# Patient Record
Sex: Male | Born: 1990 | Race: White | Hispanic: No | Marital: Married
Health system: Midwestern US, Academic
[De-identification: ages and names within clinical notes are randomized; demographics above are authoritative.]

---

## 2020-08-26 ENCOUNTER — Encounter: Admit: 2020-08-26 | Discharge: 2020-08-26

## 2020-08-26 ENCOUNTER — Ambulatory Visit: Admit: 2020-08-26 | Discharge: 2020-08-26

## 2020-08-26 DIAGNOSIS — R079 Chest pain, unspecified: Secondary | ICD-10-CM

## 2021-05-06 ENCOUNTER — Encounter: Admit: 2021-05-06 | Discharge: 2021-05-06 | Payer: Private Health Insurance - Indemnity

## 2021-05-10 ENCOUNTER — Encounter: Admit: 2021-05-10 | Discharge: 2021-05-10 | Payer: Private Health Insurance - Indemnity

## 2021-05-10 NOTE — Telephone Encounter
05/10/21 - Records requested from Dr Herschell Dimes [phone], and Pauline Aus [phone] per [facility] note below lkg    --Lona Kettle, MD [phone]--  --[facility] [phone]--

## 2021-05-11 ENCOUNTER — Encounter: Admit: 2021-05-11 | Discharge: 2021-05-11 | Payer: Private Health Insurance - Indemnity

## 2021-05-12 ENCOUNTER — Encounter: Admit: 2021-05-12 | Discharge: 2021-05-12 | Payer: Private Health Insurance - Indemnity

## 2021-05-12 ENCOUNTER — Ambulatory Visit: Admit: 2021-05-12 | Discharge: 2021-05-12 | Payer: Private Health Insurance - Indemnity

## 2021-05-12 ENCOUNTER — Ambulatory Visit: Admit: 2021-05-12 | Discharge: 2021-05-13 | Payer: Private Health Insurance - Indemnity

## 2021-05-12 DIAGNOSIS — Z8279 Family history of other congenital malformations, deformations and chromosomal abnormalities: Secondary | ICD-10-CM

## 2021-05-12 DIAGNOSIS — R079 Chest pain, unspecified: Secondary | ICD-10-CM

## 2021-05-12 DIAGNOSIS — Z289 Immunization not carried out for unspecified reason: Secondary | ICD-10-CM

## 2021-05-12 DIAGNOSIS — I10 Essential (primary) hypertension: Secondary | ICD-10-CM

## 2021-05-12 DIAGNOSIS — N289 Disorder of kidney and ureter, unspecified: Secondary | ICD-10-CM

## 2021-05-12 DIAGNOSIS — E669 Obesity, unspecified: Secondary | ICD-10-CM

## 2021-05-12 DIAGNOSIS — Q231 Congenital insufficiency of aortic valve: Secondary | ICD-10-CM

## 2021-05-12 DIAGNOSIS — R072 Precordial pain: Secondary | ICD-10-CM

## 2021-05-12 DIAGNOSIS — I208 Other forms of angina pectoris: Secondary | ICD-10-CM

## 2021-05-12 DIAGNOSIS — E6609 Other obesity due to excess calories: Secondary | ICD-10-CM

## 2021-05-12 DIAGNOSIS — Z8249 Family history of ischemic heart disease and other diseases of the circulatory system: Secondary | ICD-10-CM

## 2021-05-12 DIAGNOSIS — U071 COVID-19 virus infection: Secondary | ICD-10-CM

## 2021-05-12 DIAGNOSIS — Z136 Encounter for screening for cardiovascular disorders: Secondary | ICD-10-CM

## 2021-05-12 NOTE — Progress Notes
Date of Service: 05/12/2021    Edward Mathis is a 31 y.o. male.       HPI      Edward Mathis is a 31 y.o. white  male with a history of hypertension since he was in 6 grade, transient renal insufficiency (peak creatinine 1.4 mg/dL), family history of bicuspid aortic valve, obesity with a BMI 37.02 kg/m?, he history of COVID-19 19 viral syndrome in March 2020, and history of chest pain that prompted an ER visit on 05/04/2021.    Patient reports that prior to these hospital evaluation he had been experiencing chest pain for several days, he describes it as a squeezing sensation, a stinging pain that radiates across the precordium, associated with lightheadedness, it lasted for a few hours, and it occurred at rest and with physical activity.    Patient was evaluated in the emergency room department in [facility] on 05/04/2021, upon presentation to the hospital patient's blood pressure was 122/58 mmHg with a heart rate of 96 bpm.    Laboratory work was remarkable for an elevated creatinine up to 1.4 mg/dL, the Chem-7 dated 1/61/0960 demonstrated a normal creatinine of 1.12 mg/dL.  The troponin markers were unremarkable.    Cholesterol profile demonstrated the following: TC = 201 mg/dL, TG = 454 mg/dL, HDL = 40 mg/dL, and LDL = 098 mg/dL.    Patient reports that since released from the emergency room department he did experience some episodes of chest pain but was less intense.    He was evaluated with the following studies:    1.  2D echo Doppler study dated 05/12/2021- normal LVEF, the aortic valve was poorly visualized, and although it was stated that there were no valvular abnormalities, it is uncertain whether a bicuspid aortic valve is present.  2.  Perfusion imaging study dated 05/12/2021- the tomographic images were reviewed by me, they did not demonstrate any ischemia, SSS equals 0, SRS equals 0, LVEF 60%, normal LVEDV = 107 mL.    Family history: Patient has a brother that has a bicuspid aortic valve, 1 uncle also has a bicuspid aortic valve, grandmother had an MI at age 74, the other grandmother had an MI at age 54, both parents have hypertension    Social history: Patient works with heavy equipment.  He does not smoke cigarettes and does not drink alcohol.           Vitals:    05/12/21 1449 05/12/21 1458   BP: 136/76 132/76   BP Source: Arm, Left Upper Arm, Right Upper   Pulse: 100    SpO2: 98%    O2 Percent: 98 %    O2 Device: None (Room air)    PainSc: Three    Weight: 120.4 kg (265 lb 6.4 oz)    Height: 180.3 cm (5' 11)      Body mass index is 37.02 kg/m?Marland Kitchen     Past Medical History  Patient Active Problem List    Diagnosis Date Noted   ? Family history of bicuspid aortic valve 05/12/2021   ? Renal insufficiency 05/12/2021   ? Class 2 obesity without serious comorbidity with body mass index (BMI) of 37.0 to 37.9 in adult 05/12/2021   ? 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 Chest pain 05/11/2021   ? Primary hypertension 05/11/2021         Review of Systems   Constitutional: Positive for malaise/fatigue.   HENT: Negative.    Eyes: Negative.  Cardiovascular: Positive for chest pain.   Respiratory: Positive for snoring.    Endocrine: Positive for polydipsia.   Hematologic/Lymphatic: Negative.    Skin: Negative.    Musculoskeletal: Positive for back pain.   Gastrointestinal: Positive for bloating, abdominal pain and heartburn.   Genitourinary: Negative.    Neurological: Positive for light-headedness and paresthesias.   Psychiatric/Behavioral: Negative.    Allergic/Immunologic: Negative.        Physical Exam  General Appearance: normal in appearance  Skin: warm, moist, no ulcers or xanthomas  Eyes: conjunctivae and lids normal, pupils are equal and round  Lips & Oral Mucosa: no pallor or cyanosis  Neck Veins: neck veins are flat, neck veins are not distended  Chest Inspection: chest is normal in appearance  Respiratory Effort: breathing comfortably, no respiratory distress  Auscultation/Percussion: lungs clear to auscultation, no rales or rhonchi, no wheezing  Cardiac Rhythm: regular rhythm and normal rate  Cardiac Auscultation: S1, S2 normal, no rub, no gallop  Murmurs: no murmur  Carotid Arteries: normal carotid upstroke bilaterally, no bruit  Lower Extremity Edema: no lower extremity edema  Abdominal Exam: soft, non-tender, no masses, bowel sounds normal  Liver & Spleen: no organomegaly  Language and Memory: patient responsive and seems to comprehend information  Neurologic Exam: neurological assessment grossly intact      Cardiovascular Studies  Twelve-lead EKG demonstrates normal sinus rhythm, no ST segment T wave changes, ventricular rate 90 bpm, normal PR/QT/QTc interval    Cardiovascular Health Factors  Vitals BP Readings from Last 3 Encounters:   05/12/21 132/76   05/12/21 122/58   08/26/20 122/80     Wt Readings from Last 3 Encounters:   05/12/21 120.4 kg (265 lb 6.4 oz)   05/12/21 116 kg (255 lb 11.7 oz)   08/26/20 116.6 kg (257 lb)     BMI Readings from Last 3 Encounters:   05/12/21 37.02 kg/m?   05/12/21 35.67 kg/m?   08/26/20 34.86 kg/m?      Smoking Social History     Tobacco Use   Smoking Status Never   Smokeless Tobacco Former   ? Types: Chew      Lipid Profile Cholesterol   Date Value Ref Range Status   04/09/2019 201 (H) <200 Final     HDL   Date Value Ref Range Status   04/09/2019 40  Final     LDL   Date Value Ref Range Status   04/09/2019 123 (H) <100 Final     Triglycerides   Date Value Ref Range Status   04/09/2019 192 (H) <150 Final      Blood Sugar No results found for: HGBA1C  Glucose   Date Value Ref Range Status   05/04/2021 103  Final          Problems Addressed Today  Encounter Diagnoses   Name Primary?   ? Primary hypertension Yes   ? Precordial pain    ? Screening for heart disease    ? Bicuspid aortic valve    ? Family history of bicuspid aortic valve    ? Renal insufficiency    ? Class 2 obesity due to excess calories without serious comorbidity with body mass index (BMI) of 37.0 to 37.9 in adult    ? Atypical angina (HCC)    ? COVID-19 virus infection    ? COVID-19 vaccine dose not administered        Assessment and Plan    Assessment:    1.  Atypical anginaPatient  did present with atypical symptoms  ? He was evaluated with a perfusion imaging study on 05/12/2021-no ischemia, normal LVEF  2.  Family history of bicuspid aortic valve- a brother and an uncle have a bicuspid aortic valve  ? He was evaluated with a 2D echo Doppler study on 08/26/2020 and today on 05/12/2021, I reviewed both studies, the aortic valve is suboptimally visualized and it cannot be determined with certainty that patient does not have a bicuspid aortic valve  3.  Primary hypertension-patient was diagnosed with this when he was in sixth grade, he is currently treated with a combination of olmesartan/hydrochlorothiazide  4.  Obesity-patient's BMI 37.02 kg/m?  5.  Family history of hypertension  6.  History of COVID-19 viral syndrome in March 2020  7.  Patient did not go COVID-19 mRNA vaccine  8.  Transient renal insufficiency  ? Upon presentation to the emergency room on 05/04/2021 patient's creatinine was 1.4 mg/dL it currently normalized, it is 1.12 mg/dL on 1/91/4782  9.  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  I suggested further evaluation with a limited 2D echo Doppler study in our [facility] office to determine whether patient does/does not have a bicuspid aortic valve  2.  If this limited echocardiogram will not be relevant, then we will proceed with a TEE evaluation  3.  We discussed at length about the importance of weight reduction, overall risk factors modification, strict low-sodium diet, low-fat, low-cholesterol, low sugar, low carbohydrate diet  4.  If patient will be found to have a bicuspid arctic valve and we will also proceed with a evaluation of the aorta  On the 2D echo Doppler study dated 05/12/2021 the sinuses of Valsalva measure 3.9 cm (normal range), and the proximal ascending aorta was 2.7 cm (normal range).         Total Time Today was 65 minutes in the following activities: Preparing to see the patient, Obtaining and/or reviewing separately obtained history, Performing a medically appropriate examination and/or evaluation, Counseling and educating the patient/family/caregiver, Ordering medications, tests, or procedures, Referring and communication with other health care professionals (when not separately reported), Documenting clinical information in the electronic or other health record, Independently interpreting results (not separately reported) and communicating results to the patient/family/caregiver and Care coordination (not separately reported)         Current Medications (including today's revisions)  ? aspirin EC 81 mg tablet Take 81 mg by mouth daily. Take with food.   ? olmesartan-hydroCHLOROthiazide (BENICAR HCT) 40-12.5 mg tablet Take 1 tablet by mouth daily.

## 2021-05-13 ENCOUNTER — Encounter: Admit: 2021-05-13 | Discharge: 2021-05-13 | Payer: Private Health Insurance - Indemnity

## 2021-06-01 ENCOUNTER — Encounter: Admit: 2021-06-01 | Discharge: 2021-06-01 | Payer: Private Health Insurance - Indemnity

## 2021-06-01 ENCOUNTER — Ambulatory Visit: Admit: 2021-06-01 | Discharge: 2021-06-01 | Payer: Private Health Insurance - Indemnity

## 2021-06-01 DIAGNOSIS — Q231 Congenital insufficiency of aortic valve: Secondary | ICD-10-CM

## 2021-06-01 DIAGNOSIS — R072 Precordial pain: Secondary | ICD-10-CM

## 2021-06-01 DIAGNOSIS — Z136 Encounter for screening for cardiovascular disorders: Secondary | ICD-10-CM

## 2021-06-01 DIAGNOSIS — I10 Essential (primary) hypertension: Secondary | ICD-10-CM

## 2021-06-02 ENCOUNTER — Encounter: Admit: 2021-06-02 | Discharge: 2021-06-02 | Payer: Private Health Insurance - Indemnity

## 2021-06-02 NOTE — Telephone Encounter
-----   Message from Dorris Fetch, MD sent at 06/02/2021 12:35 PM CST -----  Please set the patient up for a TEE, it appears that he does have a bicuspid aortic valve, which is not certain though, I think we need to clarify this.  This TEE can be scheduled whenever it is convenient for the patient and when we can put it in our schedule.    Thank you      ----- Message -----  From: Dorris Fetch, MD  Sent: 06/02/2021  12:34 PM CST  To: Dorris Fetch, MD

## 2023-07-11 IMAGING — US ECHOCOMPL
1 series · 13 of 24 positions shown · non-contrast
Comparison: none

[Series 1: us echo 2d, complete · 105 acquisitions, 13 frames shown]
[im 1/105]
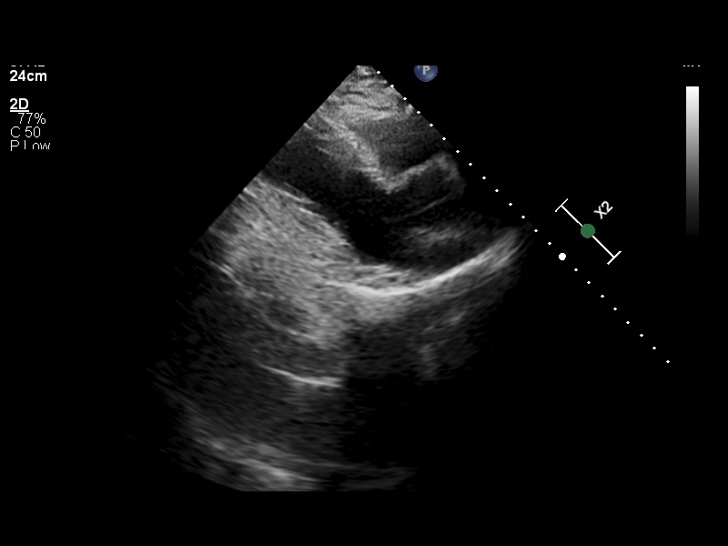
[im 10/105]
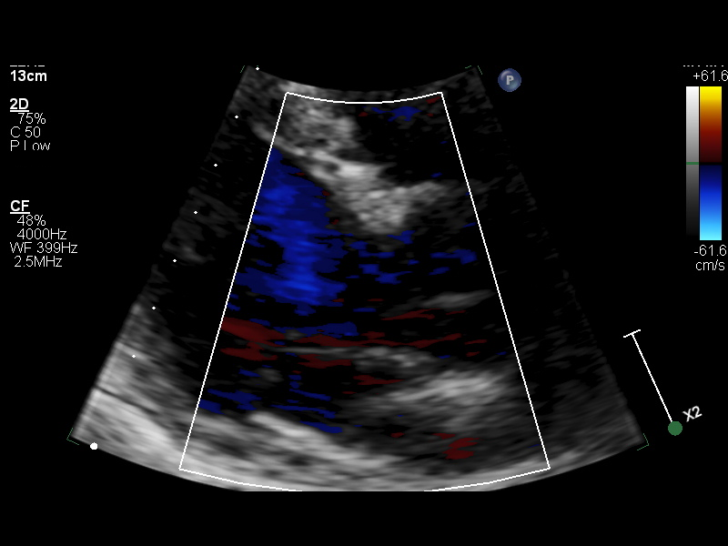
[im 19/105]
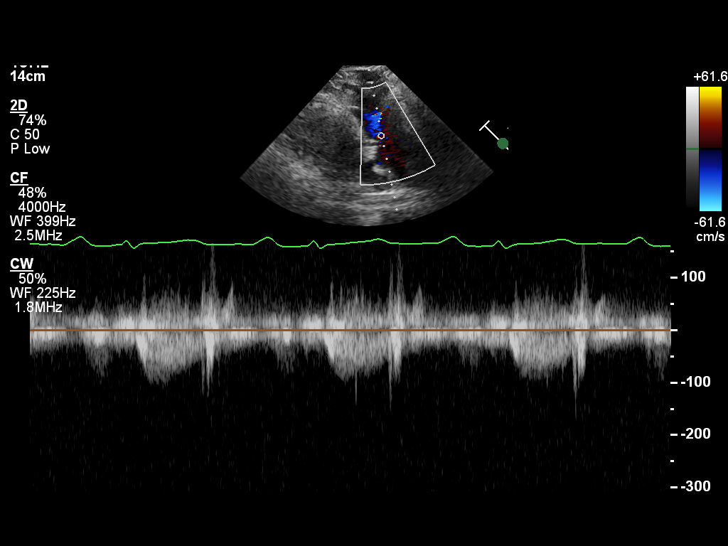
[im 28/105]
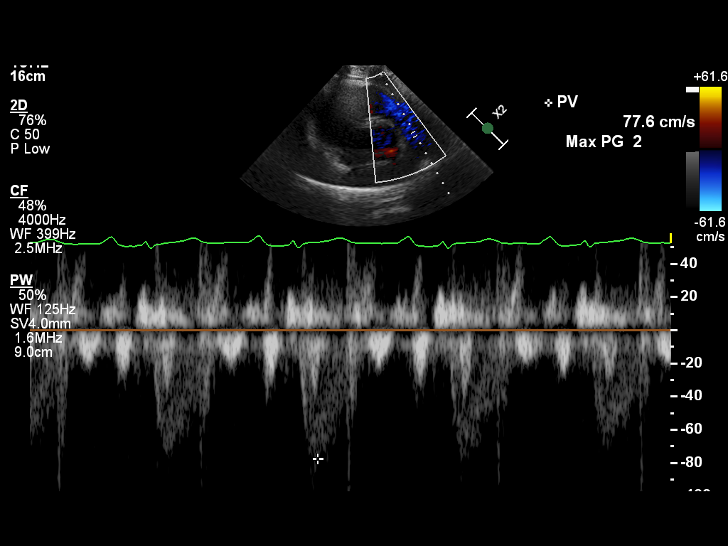
[im 37/105]
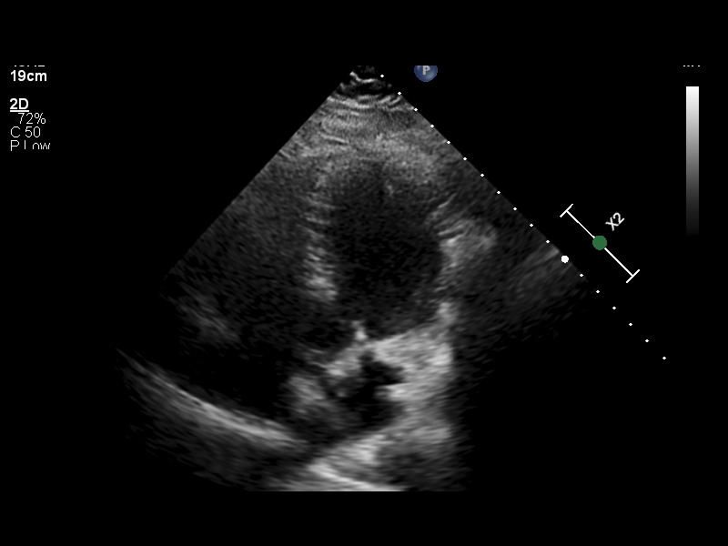
[im 46/105]
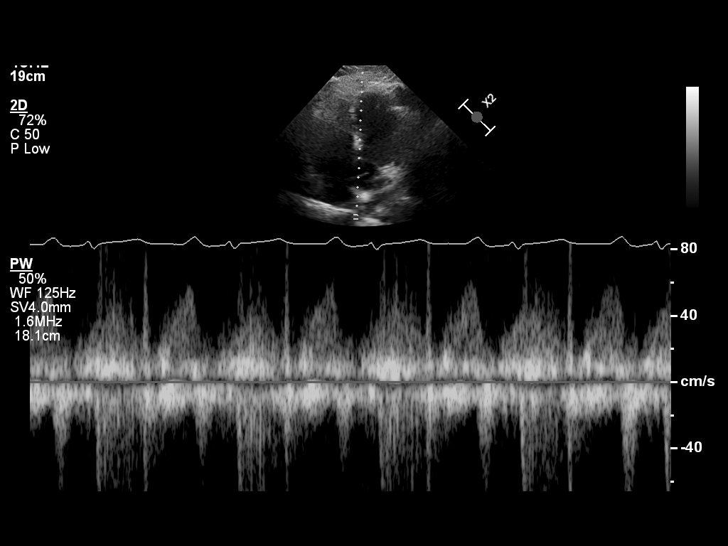
[im 55/105]
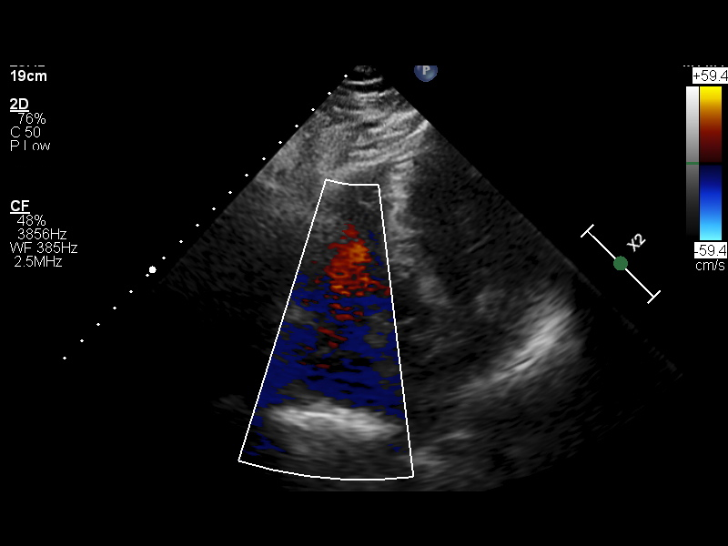
[im 59/105]
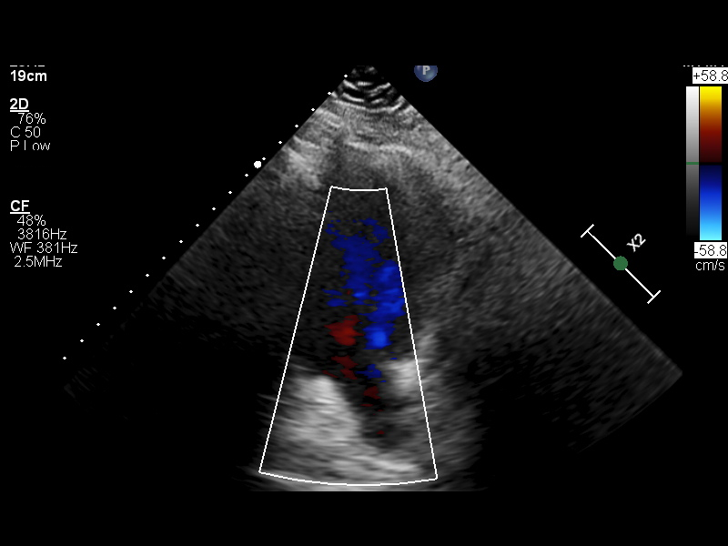
[im 68/105]
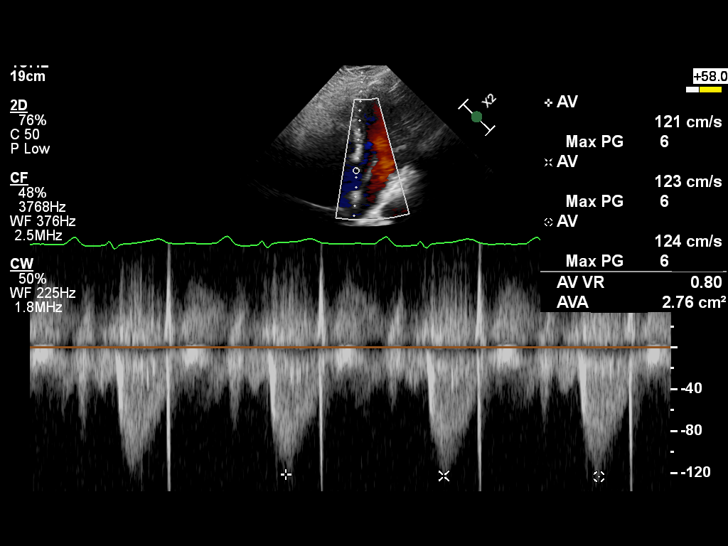
[im 82/105]
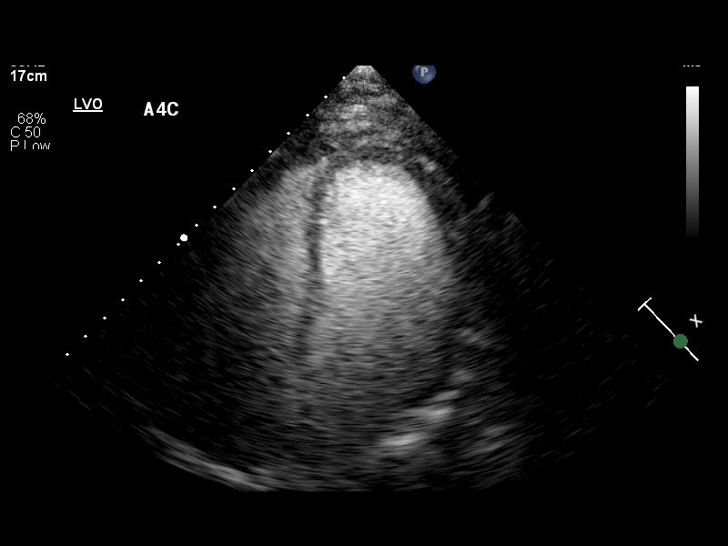
[im 86/105]
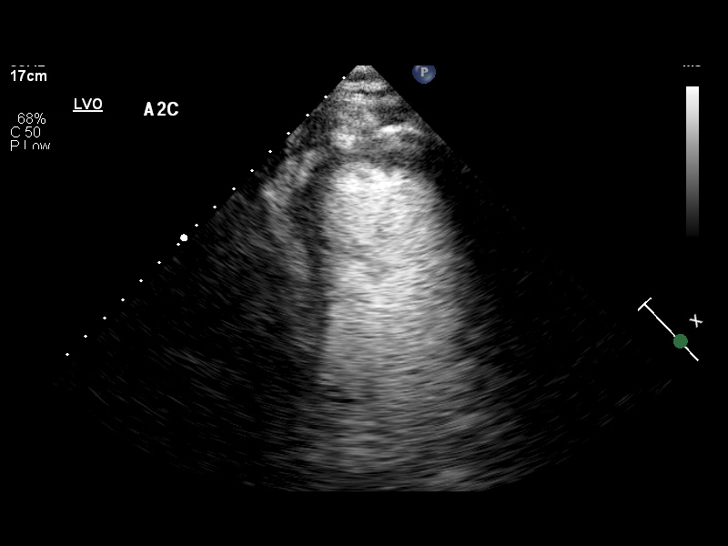
[im 95/105]
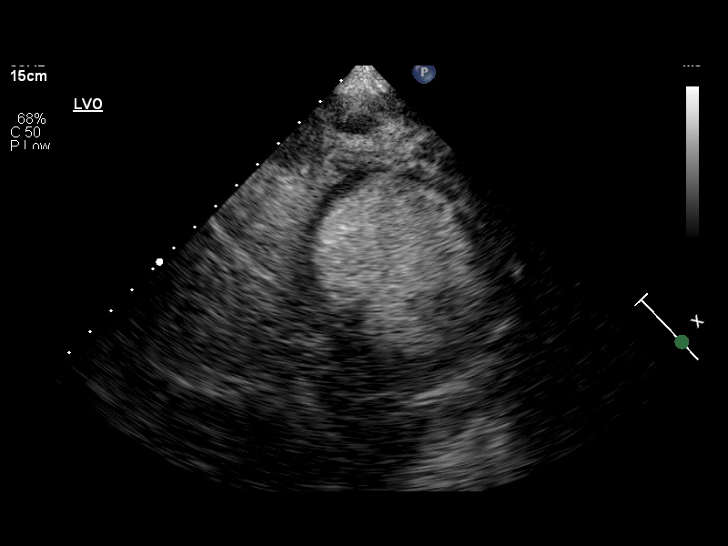
[im 105/105]
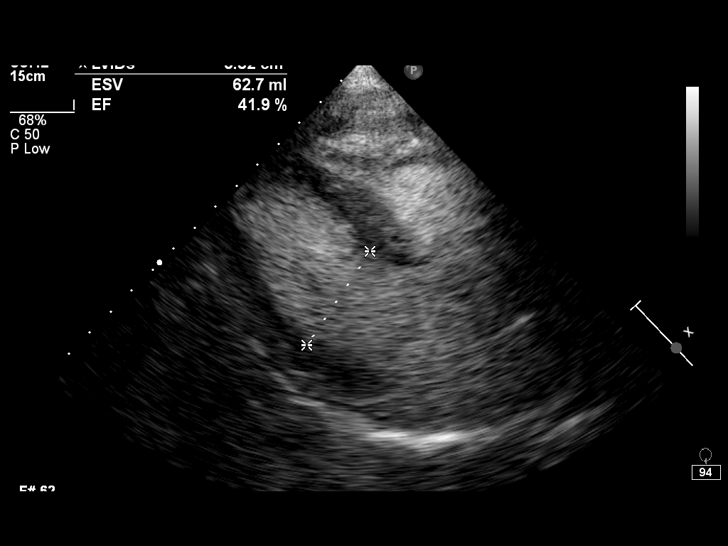

[13 of 24 positions shown; findings below may reference images not displayed]

05/12/21 -  2D + DOPPLER ECHO
Location Performed: [HOSPITAL]

Referring Provider: Bianca
Interpreting Physician: Ashiba, Hirosato
Hahaah:
Location of Interp:
Sonographer: External Staff

Indications:           Chest Pain

Definity contrast was given to enhance imaging.

Vitals
Height   Weight   BSA (Calculated)   BP   Comments
180.3 cm (5' 11")   116 kg (255 lb 11.7 oz)   2.41   122/58

Interpretation Summary
The right ventricle appears to be mildly dilated visually, other chamber sizes are normal
Right ventricular function is normal
Normal left ventricular systolic function, with an ejection fraction estimated at 60%
No hemodynamically significant valvular abnormalities identified

In comparison to previous echocardiogram from 2622, there is no significant change

Echocardiographic Findings
Left Ventricle   The left ventricular size is normal. The left ventricular wall thickness is normal.
Normal geometry. The left ventricular systolic function is normal. The ejection fraction by
Simpson's biplane method is 56%. Normal left ventricular diastolic function.
Right Ventricle   The right ventricle is mildly dilated. The right ventricular wall thickness is
normal. The right ventricular systolic function is normal. The pulmonary artery pressure could not
be estimated due to inadequate tricuspid regurgitation signal.
Left Atrium   Normal size.
Right Atrium   Normal size.
IVC/SVC   Normal central venous pressure (0-5 mm Hg).
Mitral Valve   Normal valve structure. No stenosis. No regurgitation.
Tricuspid Valve   Normal valve structure. No stenosis. No regurgitation.
Aortic Valve   The aortic valve was not well seen. No stenosis. No regurgitation.
Pulmonary   The pulmonic valve was not seen well but no Doppler evidence of stenosis.
Aorta   The aortic root and ascending aorta are normal in size.

Left Heart 2D Measurements (Normal Ranges)
EF (Simpson's)
56 %
LVIDD
5.3 cm  (Range: 4.2 - 5.8)
LVIDS
3.5 cm  (Range: 2.5 - 4.0)
IVS
0.6 cm  (Range: 0.6 - 1.0)
LV PW
0.7 cm  (Range: 0.6 - 1.0)
LA Size
2.9 cm  (Range: 3.0 - 4.0)

Right Heart 2D   M-Mode Measurements (Normal Ranges) (Range)
RV Basal Dia
2.8 cm  (2.5 - 4.1)
RV Mid Dia
2.8 cm  (1.9 - 3.5)
AZLYNN
16.0 cm2  (<18)

Left Heart 2D Addnl Measurements (Normal Ranges)
LV Systolic Vol
67 mL  (Range: 21 - 61)
LV Systolic Vol Index
28 mL/m2  (Range: 11 - 31)
LV Diastolic Vol
155 mL  (Range: 62 - 150)
LV Diastolic Vol Index
64 mL/m2  (Range: 34 - 74)
LA Vol
40 mL  (Range: 18 - 58)
LA Vol Index
16.60 mL/m2  (Range: 16 - 34)
LV Mass
116 g  (Range: 88 - 224)
LV Mass Index
48 g/m2  (Range: 49 - 115)
RWT
0.26  (Range: <=0.42)

Aortic Root Measurements (Normal Ranges)
Sinus
3.9 cm  (Range: 2.8 - 4.0)
AO Prox
2.7 cm  (Range: 2.6 - 3.8)

Doppler (Spectral and Color Flow)
Aortic valve peak velocity
1.2 m/s

Tech Notes:

Definity RT 7117. NKDA. JL
